# Patient Record
Sex: Female | Born: 2000 | Race: White | Hispanic: No | Marital: Single | Smoking: Never smoker
Health system: Southern US, Community
[De-identification: ages and names within clinical notes are randomized; demographics above are authoritative.]

## PROBLEM LIST (undated history)

## (undated) DIAGNOSIS — J45909 Unspecified asthma, uncomplicated: Secondary | ICD-10-CM

## (undated) DIAGNOSIS — F319 Bipolar disorder, unspecified: Secondary | ICD-10-CM

## (undated) DIAGNOSIS — R030 Elevated blood-pressure reading, without diagnosis of hypertension: Secondary | ICD-10-CM

## (undated) HISTORY — DX: Unspecified asthma, uncomplicated: J45.909

## (undated) HISTORY — PX: WISDOM TOOTH EXTRACTION: SHX21

## (undated) HISTORY — DX: Bipolar disorder, unspecified: F31.9

## (undated) HISTORY — DX: Elevated blood-pressure reading, without diagnosis of hypertension: R03.0

---

## 2020-03-21 DIAGNOSIS — R7401 Elevation of levels of liver transaminase levels: Secondary | ICD-10-CM | POA: Insufficient documentation

## 2020-03-21 DIAGNOSIS — R197 Diarrhea, unspecified: Secondary | ICD-10-CM | POA: Insufficient documentation

## 2020-03-22 DIAGNOSIS — F319 Bipolar disorder, unspecified: Secondary | ICD-10-CM | POA: Insufficient documentation

## 2020-05-03 DIAGNOSIS — M2550 Pain in unspecified joint: Secondary | ICD-10-CM | POA: Insufficient documentation

## 2020-05-03 DIAGNOSIS — R1114 Bilious vomiting: Secondary | ICD-10-CM | POA: Insufficient documentation

## 2020-05-03 DIAGNOSIS — K2100 Gastro-esophageal reflux disease with esophagitis, without bleeding: Secondary | ICD-10-CM | POA: Insufficient documentation

## 2020-05-03 DIAGNOSIS — Z8619 Personal history of other infectious and parasitic diseases: Secondary | ICD-10-CM | POA: Insufficient documentation

## 2020-05-03 DIAGNOSIS — R000 Tachycardia, unspecified: Secondary | ICD-10-CM | POA: Insufficient documentation

## 2020-05-03 DIAGNOSIS — I10 Essential (primary) hypertension: Secondary | ICD-10-CM | POA: Insufficient documentation

## 2020-05-23 ENCOUNTER — Other Ambulatory Visit: Payer: Self-pay | Admitting: Infectious Diseases

## 2020-05-23 DIAGNOSIS — R1114 Bilious vomiting: Secondary | ICD-10-CM

## 2020-05-23 DIAGNOSIS — F319 Bipolar disorder, unspecified: Secondary | ICD-10-CM

## 2020-05-30 DIAGNOSIS — R030 Elevated blood-pressure reading, without diagnosis of hypertension: Secondary | ICD-10-CM | POA: Insufficient documentation

## 2020-05-30 DIAGNOSIS — R42 Dizziness and giddiness: Secondary | ICD-10-CM | POA: Insufficient documentation

## 2020-05-31 ENCOUNTER — Ambulatory Visit
Admission: RE | Admit: 2020-05-31 | Discharge: 2020-05-31 | Disposition: A | Discharge status: Home / Self Care | Payer: 59 | Source: Ambulatory Visit | Attending: Infectious Diseases | Admitting: Infectious Diseases

## 2020-05-31 ENCOUNTER — Other Ambulatory Visit: Payer: Self-pay

## 2020-05-31 DIAGNOSIS — R1114 Bilious vomiting: Secondary | ICD-10-CM | POA: Diagnosis present

## 2020-05-31 DIAGNOSIS — F319 Bipolar disorder, unspecified: Secondary | ICD-10-CM | POA: Insufficient documentation

## 2020-08-09 ENCOUNTER — Encounter: Payer: Self-pay | Admitting: Internal Medicine

## 2020-09-13 ENCOUNTER — Ambulatory Visit: Payer: 59 | Admitting: Internal Medicine

## 2020-09-18 DIAGNOSIS — R058 Other specified cough: Secondary | ICD-10-CM | POA: Insufficient documentation

## 2020-10-04 ENCOUNTER — Other Ambulatory Visit: Payer: Self-pay

## 2020-10-04 ENCOUNTER — Encounter: Payer: Self-pay | Admitting: Internal Medicine

## 2020-10-04 ENCOUNTER — Ambulatory Visit (INDEPENDENT_AMBULATORY_CARE_PROVIDER_SITE_OTHER): Payer: 59 | Admitting: Internal Medicine

## 2020-10-04 VITALS — BP 135/86 | HR 109 | Ht 67.0 in | Wt 154.0 lb

## 2020-10-04 DIAGNOSIS — J45909 Unspecified asthma, uncomplicated: Secondary | ICD-10-CM | POA: Insufficient documentation

## 2020-10-04 DIAGNOSIS — R000 Tachycardia, unspecified: Secondary | ICD-10-CM | POA: Diagnosis not present

## 2020-10-04 MED ORDER — METOPROLOL SUCCINATE ER 50 MG PO TB24: 50.0000 mg | ORAL_TABLET | Freq: Every day | ORAL | 6 refills | Status: DC

## 2020-10-04 NOTE — Patient Instructions (Addendum)
Medication Instructions:  - Your physician has recommended you make the following change in your medication:   1) INCREASE Metoprolol succinate to 50 mg- take 1 tablet by mouth once daily   *If you need a refill on your cardiac medications before your next appointment, please call your pharmacy*   Lab Work: - none ordered  If you have labs (blood work) drawn today and your tests are completely normal, you will receive your results only by: MyChart Message (if you have MyChart) OR A paper copy in the mail If you have any lab test that is abnormal or we need to change your treatment, we will call you to review the results.   Testing/Procedures: - none ordered   Follow-Up: At [facility], you and your health needs are our priority.  As part of our continuing mission to provide you with exceptional heart care, we have created designated Provider Care Teams.  These Care Teams include your primary Cardiologist (physician) and Advanced Practice Providers (APPs -  Physician Assistants and Nurse Practitioners) who all work together to provide you with the care you need, when you need it.  We recommend signing up for the patient portal called "MyChart".  Sign up information is provided on this After Visit Summary.  MyChart is used to connect with patients for Virtual Visits (Telemedicine).  Patients are able to view lab/test results, encounter notes, upcoming appointments, etc.  Non-urgent messages can be sent to your provider as well.   To learn more about what you can do with MyChart, go to [email or web address].    Your next appointment:   6-8 weeks  (Thursday 12/06/20 @ 10:20 am)  The format for your next appointment:   In Person  Provider:   Sherryl Manges, MD   Other Instructions  1) Take 2 liquid IV a day

## 2020-10-04 NOTE — Progress Notes (Signed)
[place]     ELECTROPHYSIOLOGY CONSULT NOTE  Patient ID: Angelica Green, MRN: [id number], DOB/AGE: 09-Nov-2000 19 y.o. Admit date: (Not on file) Date of Consult: 10/04/2020  Primary Physician: Angelica Sell, MD Primary Cardiologist: Angelica Green is a 20 y.o. 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    HPI Angelica Green is a 20 y.o. female with a longstanding history of anxiety/bipolar disorder who last summer developed Lyme disease.  She was treated in the fall with antibiotics that was then complicated by C. difficile colitis requiring additional therapies.  Following these therapies she developed tachy palpitations aggravated by exercise heat showers often associated with lightheadedness and presyncope.  She underwent testing which was not available today but which apparently described heart rates in the 180s.  Plasma catecholamine levels were normal.  She was started on a beta-blocker.  Lightheadedness with standing yes; shower intolerance yes; menses no: heat intolerance yes/; exercise intolerance yes.  Diet is deplete of sodium; deplete of water.  She was told earlier want to increase her sodium and then told not to, this would be appropriate in the context of her elevated blood pressures as noted below  Does not use alcohol; does not use marijuana.  Syncope no.    Palpitations yes.    Recent viral infection.     She describes getting ill as a freshman in high school where in she was found to have orthostatic hypertension and bradycardia (see below).  The symptoms abated.  Some of her tachy palpitations abated as she was undergoing therapy for her Lyme disease only to recur following her C. Difficile  She also reports hypersomnolence.  She fell asleep during her biochemistry examination.  She feels unable to drive more than about 30 minutes at a time.  Reviewing her heart rate monitorv heart rates are in the 70s during  sleep  Has episodic vomiting.  This can be associate with prolonged concentration, prolonged heat exposure.  This occurred initially in the freshman illness where she was vomiting tens of times per hour.  Not attributed to stress.   Evaluation early 2022-cardiologist in [place]>> POTS based on office orthostatics; noted 2/22 here to have orthostatic tachycardia but also hypertension with blood pressures 132/90--142/98 with standing heart rates   Date Cr K Hgb TSH  2/22 0.7 4.1 14.4 1.95          Hx of Lyme, bipolar and Asthma, with Tx for Lyme complicated by increased LFTs and then C diff   Past Medical History:  Diagnosis Date   Asthma    Bipolar 1 disorder (HCC)    Elevated blood pressure reading       Surgical History:  Past Surgical History:  Procedure Laterality Date   WISDOM TOOTH EXTRACTION       Home Meds: Current Meds  Medication Sig   escitalopram (LEXAPRO) 20 MG tablet TAKE 1 TABLET BY MOUTH EVERYDAY AT BEDTIME   [name] 24 FE 1-20 MG-MCG(24) tablet Take 1 tablet by mouth daily.   lamoTRIgine (LAMICTAL) 200 MG tablet TAKE 1 TABLET BY MOUTH EVERY DAY IN THE MORNING   metoprolol succinate (TOPROL-XL) 50 MG 24 hr tablet Take 1 tablet (50 mg total) by mouth daily. Take with or immediately following a meal.   omeprazole (PRILOSEC) 40 MG capsule Take 1 capsule by mouth daily.   VYVANSE 20 MG capsule TAKE 1 CAPSULE IN THE MORNING   [DISCONTINUED]  metoprolol succinate (TOPROL-XL) 25 MG 24 hr tablet Take 1 tablet by mouth daily.    Allergies:  Allergies  Allergen Reactions   Doxycycline Nausea And Vomiting    Social History   Socioeconomic History   Marital status: Single    Spouse name: Not on file   Number of children: Not on file   Years of education: Not on file   Highest education level: Not on file  Occupational History   Not on file  Tobacco Use   Smoking status: Never    Passive exposure: Never   Smokeless tobacco: Never  Substance and Sexual  Activity   Alcohol use: Never   Drug use: Never   Sexual activity: Not on file  Other Topics Concern   Not on file  Social History Narrative   Not on file   Social Determinants of Health   Financial Resource Strain: Not on file  Food Insecurity: Not on file  Transportation Needs: Not on file  Physical Activity: Not on file  Stress: Not on file  Social Connections: Not on file  Intimate Partner Violence: Not on file     Family History  Problem Relation Age of Onset   Cancer Mother      ROS:  Please see the history of present illness.     All other systems reviewed and negative.    Physical Exam: Blood pressure 135/86, pulse (!) 109, height 5\' 7"  (1.702 m), weight 154 lb (69.9 kg), SpO2 98 %. General: Well developed, well nourished female in no acute distress. Head: Normocephalic, atraumatic, sclera non-icteric, no xanthomas, nares are without discharge. EENT: normal  Lymph Nodes:  none Neck: Negative for carotid bruits. JVD not elevated. Back:without scoliosis kyphosis Lungs: Clear bilaterally to auscultation without wheezes, rales, or rhonchi. Breathing is unlabored. Heart: RRR with S1 S2. No  /6 systolic murmur . No rubs, or gallops appreciated. Abdomen: Soft, non-tender, non-distended with normoactive bowel sounds. No hepatomegaly. No rebound/guarding. No obvious abdominal masses. Msk:  Strength and tone appear normal for age. Extremities: No clubbing or cyanosis. No edema.  Distal pedal pulses are 2+ and equal bilaterally. Skin: Warm and Dry Neuro: Alert and oriented X 3. CN III-XII intact Grossly normal sensory and motor function . Psych:  Responds to questions appropriately with a normal affect.      Labs: Cardiac Enzymes No results for input(s): CKTOTAL, CKMB, TROPONINI in the last 72 hours. CBC No results found for: WBC, HGB, HCT, MCV, PLT PROTIME: No results for input(s): LABPROT, INR in the last 72 hours. Chemistry No results for input(s): NA, K, CL,  CO2, BUN, CREATININE, CALCIUM, PROT, BILITOT, ALKPHOS, ALT, AST, GLUCOSE in the last 168 hours.  Invalid input(s): LABALBU Lipids No results found for: CHOL, HDL, LDLCALC, TRIG BNP No results found for: PROBNP Thyroid Function Tests: No results for input(s): TSH, T4TOTAL, T3FREE, THYROIDAB in the last 72 hours.  Invalid input(s): FREET3 Miscellaneous No results found for: DDIMER  Radiology/Studies:  No results found.  EKG: Sinus at 109 Interval 16/09/32   Assessment and Plan:  Post C. difficile orthostatic tachycardia/hypertension  Lyme disease-treated  Anxiety/bipolar  Intermittent vomiting  This young lady has new onset tachycardia and orthostatic intolerance following treatment for C. difficile, the antibiotics have not been prescribed for Lyme disease.  In her mind, her symptoms clearly began following the treatment for C. difficile although 6 years before some other illness was associate with orthostatic hypertension and bradycardia (???), a syndrome of which I am unaware  and the mechanism for which I cannot postulate.  Measurements recorded over the last 6 months have been associated with resting tachycardia, orthostatic increase in blood pressure and heart rate.  Nocturnal heart rates (inferred from daytime naps) have been normal.  I plasma metanephrines are normal.  I will need to review the sensitivity of these findings.  The timing of her symptoms would suggest that it is secondary to the infection/treatment as opposed to a spontaneous emergence of a separate process.  In the past, her blood pressures have been elevated; now they are on the low-normal side.  Hence have recommended increasing salt and water and we will also increase her metoprolol from 25--50 mg daily.  I have asked her to keep track of her blood pressures at home  We discussed extensively the issues of dysautonomia, the physiology of orthstasis and positional stress.  We discussed the role of salt and  water repletion, the importance of exercise, often needing to be started in the recumbent position, and the awareness of triggers and the role of ambient heat and dehydration  Encouraged her to try gentle exercise.   73 min was spent in care of the patient including the review of records      Sherryl Manges

## 2020-10-04 NOTE — Progress Notes (Signed)
Error

## 2020-10-05 ENCOUNTER — Telehealth: Payer: Self-pay | Admitting: Internal Medicine

## 2020-10-05 NOTE — Telephone Encounter (Signed)
Secure chat messages received from Dr. Graciela Husbands Dr. Earl Gala:  Dr. Graciela Husbands: Rosanne Ashing, good morning. Was wondering if you might be able to help me think as to whether referral to you for this young lady would be appropriate. She has symptoms to suggest to this ignorant old man, narcolepsy. She fell asleep during her biochemistry final. She cannot drive more than 15 or 20 minutes without being at risk for falling asleep. She has daytime somnolence in addition. Do not know whether she might have something as simple sleep apneabut was wondering if you could help me think about her.   Dr. Earl Gala: Abram Sander, I looked at her chart for her BMI to help direct my answer. It is normal. Basically a really sleepy 20 year old non-obese woman with no witnessed apnea is not likely to be OSA. A central disorder of hypersomnolence (narcolepsy with or without cataplexy, idiopathic hypersomnolence) is much more likely. Best strategy would be polysomnogram (standard night time sleep study) followed be Multiple Sleep Latency Test (MSLT) the next day. If PSG is positive for OSA the MSLT is cancelled. The problem is her escitalopram. That can suppress REM sleep (and cause [place] to miss dx of narcolepsy) and we usually want to stop for 10-14 days before if possible. However, if she then proves to have unexpected OSA you have stopped SSRI unnecessarily which is not a popular thing to do!! Sometimes I will do HST in that setting to be sure there is no OSA before tapering SSRI for PSG & MSLT. Also sometimes insurance requires HST before PSG even if I think there is no way the patient has OSA. Hope thoseideas help.  Dr. Graciela Husbands: do I order those things or are you available to see her and direct the testingthx steve  Dr. Earl Gala: It would be best if we see her first. The whole conversation about SSRI is probably more than you want to handle. My new colleague, Dr. Tempie Hoist cansee her next week or I can see her the week of 6/27.  Dr.  Graciela Husbands: Arvil Chaco that would be great We can reach out to her and 1) let her know your office will be calling or 2 ) whatever is best. her mental issues have been terrible in the past so it might involve working around the SSRI then changingit.    Dr. Earl Gala: Rip [place]. Let her know we will call. We will wait until Monday to give you time toreach her. Sometimes we do just have to do MSLT with SSRI on board   Dr Graciela Husbands: Ronal Fear could you let AT know that [place] office will be calling I had told her we woould reach out to them . thx SK

## 2020-10-05 NOTE — Telephone Encounter (Signed)
Attempted to call the patient regarding Dr. Odessa Fleming message from below. No answer- I left message stating I was sending some information through her MyChart, if she could please review that in relation to her visit yesterday.  I advised she did not need to call back unless she has any questions.

## 2020-11-05 ENCOUNTER — Encounter: Payer: Self-pay | Admitting: Internal Medicine

## 2020-12-06 ENCOUNTER — Ambulatory Visit: Payer: 59 | Admitting: Internal Medicine

## 2021-01-17 ENCOUNTER — Other Ambulatory Visit: Payer: Self-pay

## 2021-01-17 ENCOUNTER — Encounter: Payer: Self-pay | Admitting: Internal Medicine

## 2021-01-17 ENCOUNTER — Ambulatory Visit (INDEPENDENT_AMBULATORY_CARE_PROVIDER_SITE_OTHER): Payer: 59 | Admitting: Internal Medicine

## 2021-01-17 VITALS — BP 118/76 | HR 72 | Ht 67.0 in | Wt 151.0 lb

## 2021-01-17 DIAGNOSIS — G901 Familial dysautonomia [Riley-Day]: Secondary | ICD-10-CM | POA: Diagnosis not present

## 2021-01-17 DIAGNOSIS — R000 Tachycardia, unspecified: Secondary | ICD-10-CM | POA: Diagnosis not present

## 2021-01-17 NOTE — Progress Notes (Signed)
      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 difficile and Lyme disease and bipolar disorder for which she follows up closely with psychiatry  She had been initially started on low-dose metoprolol and we increased it from 25--50 which she has tolerated.  Her symptoms have been somewhat less.  Her diet is still sodium deplete but fluid intake is better  Mental health was given a bump with her mother's recent diagnosis of ALS and a 0.5-5-year prognostic time window   Records and Results Reviewed   Past Medical History:  Diagnosis Date   Asthma    Bipolar 1 disorder (HCC)    Elevated blood pressure reading     Past Surgical History:  Procedure Laterality Date   WISDOM TOOTH EXTRACTION      Current Meds  Medication Sig   escitalopram (LEXAPRO) 20 MG tablet TAKE 1 TABLET BY MOUTH EVERYDAY AT BEDTIME   [name] 24 FE 1-20 MG-MCG(24) tablet Take 1 tablet by mouth daily.   lamoTRIgine (LAMICTAL) 200 MG tablet TAKE 1 TABLET BY MOUTH EVERY DAY IN THE MORNING   metoprolol succinate (TOPROL-XL) 50 MG 24 hr tablet Take 1 tablet (50 mg total) by mouth daily. Take with or immediately following a meal.   VYVANSE 20 MG capsule TAKE 1 CAPSULE IN THE MORNING    Allergies  Allergen Reactions   Doxycycline Nausea And Vomiting      Review of Systems negative except from HPI and PMH  Physical Exam BP 118/76 (BP Location: Right Arm, Patient Position: Sitting, Cuff Size: Normal)   Pulse 72   Ht 5\' 7"  (1.702 m)   Wt 151 lb (68.5 kg)   SpO2 96%   BMI 23.65 kg/m  Well developed and well nourished in no acute distress HENT normal E scleral and icterus clear Neck Supple JVP flat; carotids brisk and full Clear to ausculation Regular rate and rhythm, no murmurs gallops or  rub Soft with active bowel sounds No clubbing cyanosis  Edema Alert and oriented, grossly normal motor and sensory function Skin Warm and Dry  ECG sinus at 72 Interval 17/09/40 Otherwise normal  CrCl cannot be calculated (No successful lab value found.).   Assessment and  Plan Post C. difficile orthostatic tachycardia/hypertension   Lyme disease-treated   Anxiety/bipolar   Overall Angelica Green has been doing better, but still with exertional intolerance some lightheadedness.  Symptom exacerbations persist around her menses. Encouraged her to increase her salt repletion from liquid IV x1--x3 a day. She is avoiding social provocations like marijuana and alcohol.  A sensible young lady.  Mom's situation is a big deal.  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  The patient does not  have concerns regarding medicines.

## 2021-01-17 NOTE — Patient Instructions (Signed)
Medication Instructions:  - Your physician recommends that you continue on your current medications as directed. Please refer to the Current Medication list given to you today.  *If you need a refill on your cardiac medications before your next appointment, please call your pharmacy*   Lab Work: - none ordered  If you have labs (blood work) drawn today and your tests are completely normal, you will receive your results only by: MyChart Message (if you have MyChart) OR A paper copy in the mail If you have any lab test that is abnormal or we need to change your treatment, we will call you to review the results.   Testing/Procedures: - none ordered   Follow-Up: At [facility], you and your health needs are our priority.  As part of our continuing mission to provide you with exceptional heart care, we have created designated Provider Care Teams.  These Care Teams include your primary Cardiologist (physician) and Advanced Practice Providers (APPs -  Physician Assistants and Nurse Practitioners) who all work together to provide you with the care you need, when you need it.  We recommend signing up for the patient portal called "MyChart".  Sign up information is provided on this After Visit Summary.  MyChart is used to connect with patients for Virtual Visits (Telemedicine).  Patients are able to view lab/test results, encounter notes, upcoming appointments, etc.  Non-urgent messages can be sent to your provider as well.   To learn more about what you can do with MyChart, go to [email or web address].    Your next appointment:   6 month(s)  The format for your next appointment:   In Person  Provider:   Sherryl Manges, MD   Other Instructions  1) INCREASE Liquid IV to 3 a day

## 2021-04-03 ENCOUNTER — Other Ambulatory Visit: Payer: Self-pay | Admitting: Internal Medicine

## 2021-07-04 ENCOUNTER — Ambulatory Visit: Payer: 59 | Admitting: Internal Medicine

## 2021-09-05 ENCOUNTER — Ambulatory Visit: Payer: 59 | Admitting: Internal Medicine

## 2022-04-14 IMAGING — CT CT HEAD W/O CM
1 series · 15 of 30 positions shown, 19 images · non-contrast
Comparison: No pertinent prior exams available for comparison.

CLINICAL DATA: Bilious vomiting without nausea. Bipolar affective
disorder, remission status unspecified. Additional history provided
by scanning technologist: Patient reports "random" vomiting with
dizziness, symptoms for 2 months, right eye vision changes.

EXAM:
CT HEAD WITHOUT CONTRAST
TECHNIQUE: Contiguous axial images were obtained from the base of the skull
through the vertex without intravenous contrast.

[Series 2: head wo · axial · 0.39mm/px · z∈[-142,-7]mm · 15 of 30 slices shown, 19 images]
[im 2/30  brain]
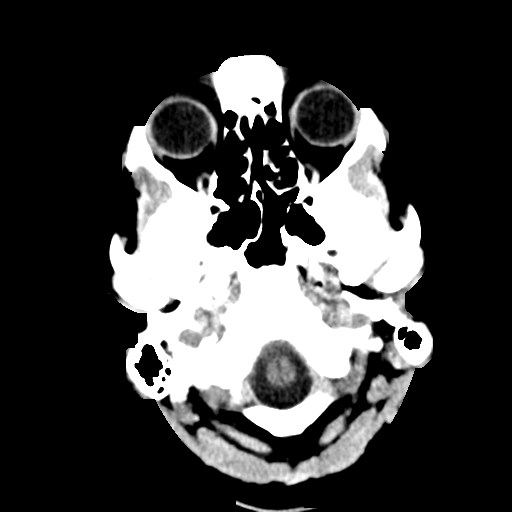
[im 2/30  bone]
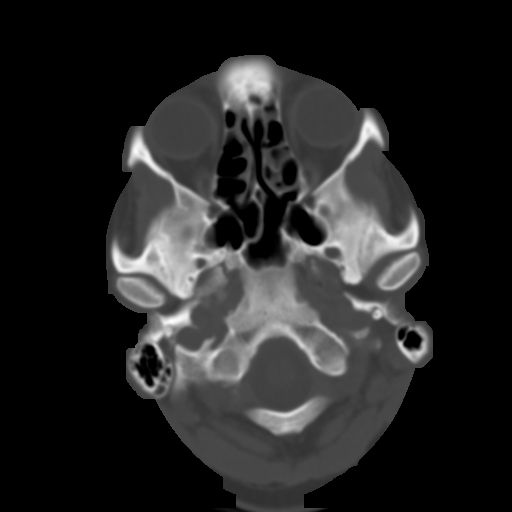
[im 4/30  brain]
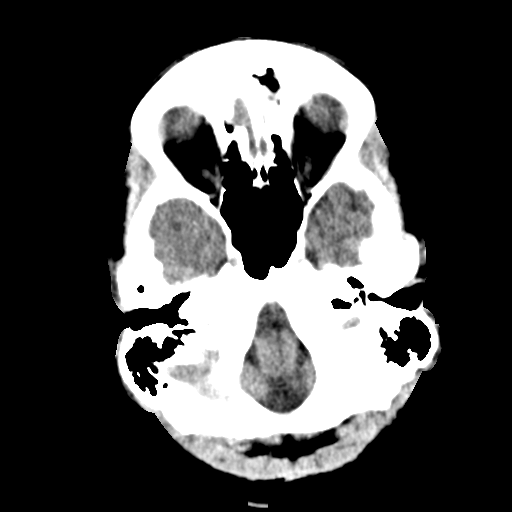
[im 6/30  brain]
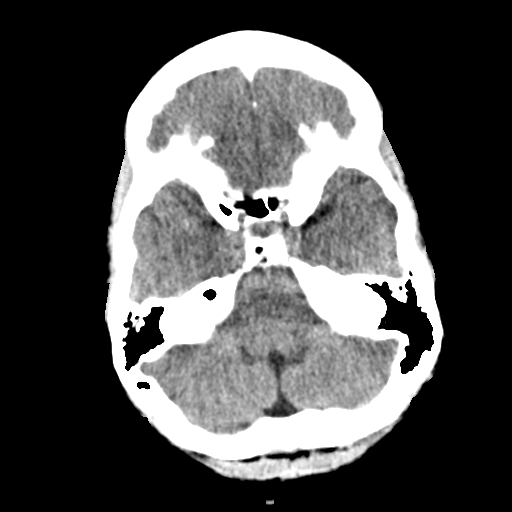
[im 8/30  brain]
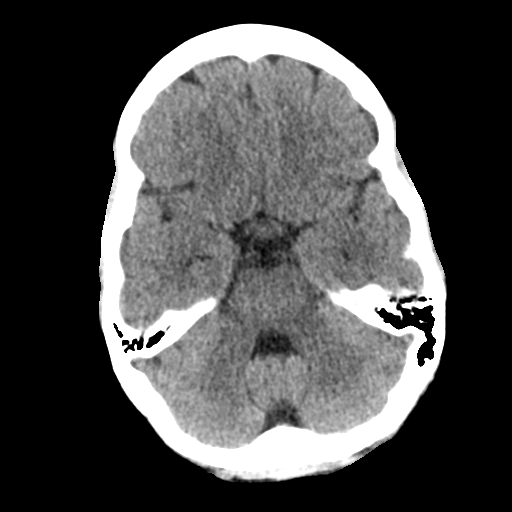
[im 10/30  brain]
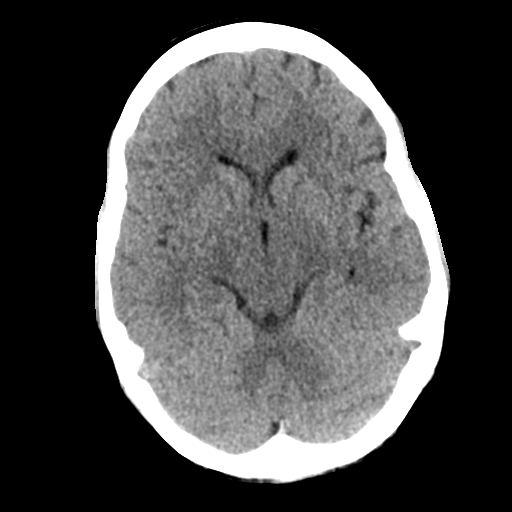
[im 10/30  bone]
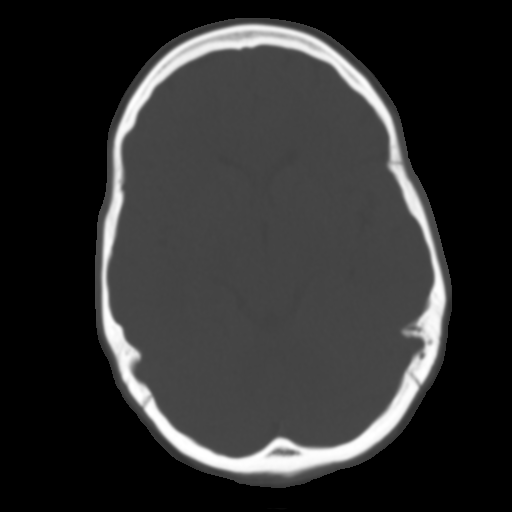
[im 12/30  brain]
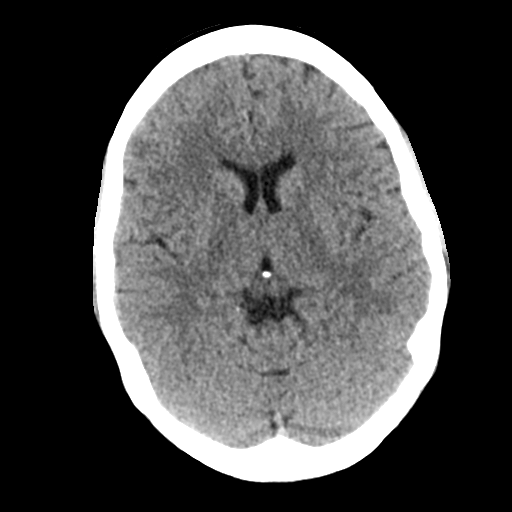
[im 14/30  brain]
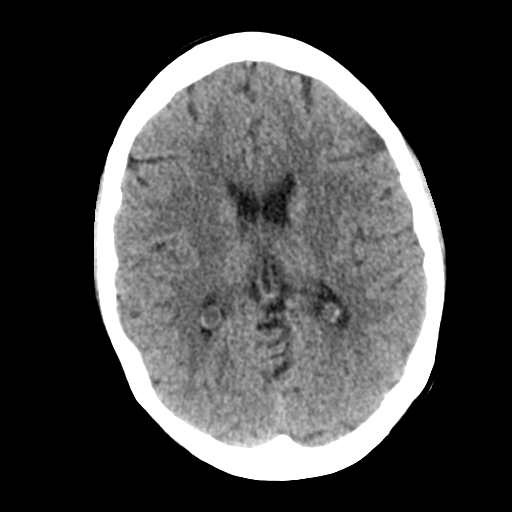
[im 16/30  brain]
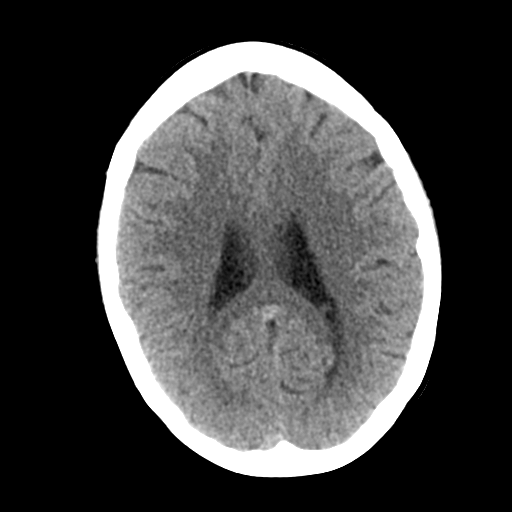
[im 17/30  brain]
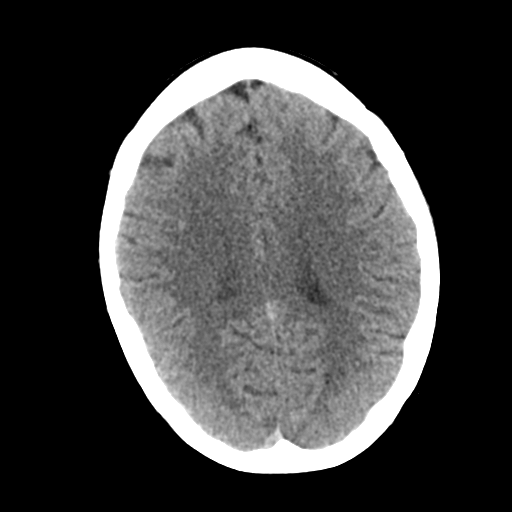
[im 17/30  bone]
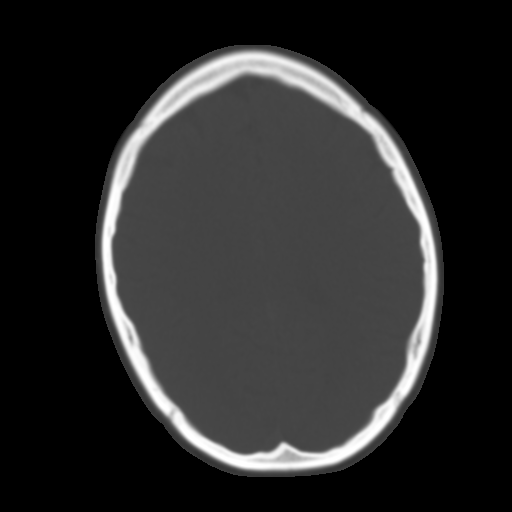
[im 19/30  brain]
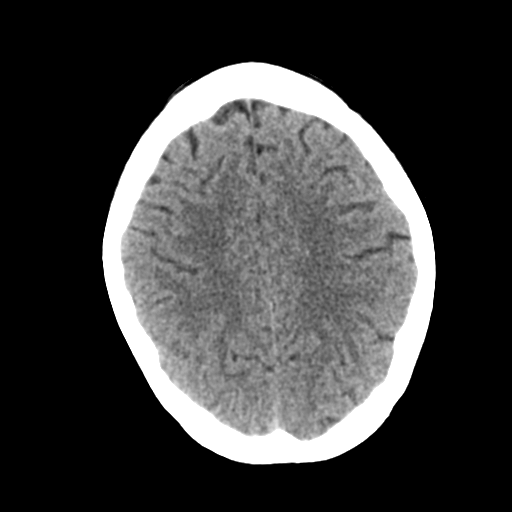
[im 21/30  brain]
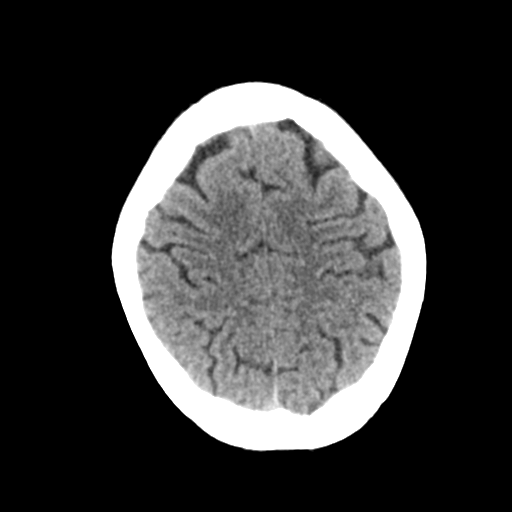
[im 23/30  brain]
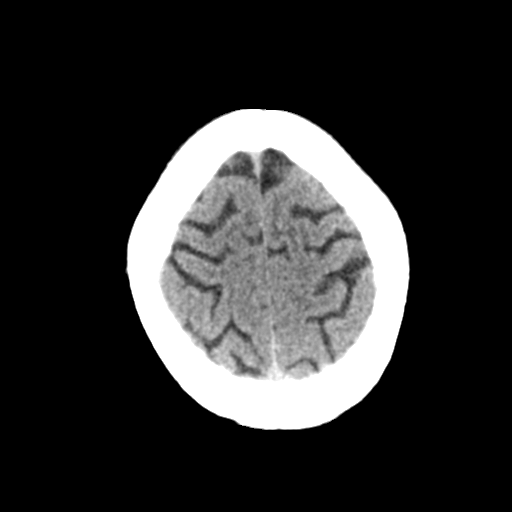
[im 25/30  brain]
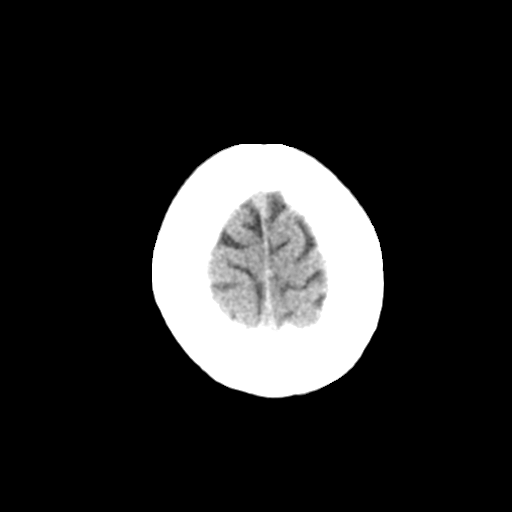
[im 25/30  bone]
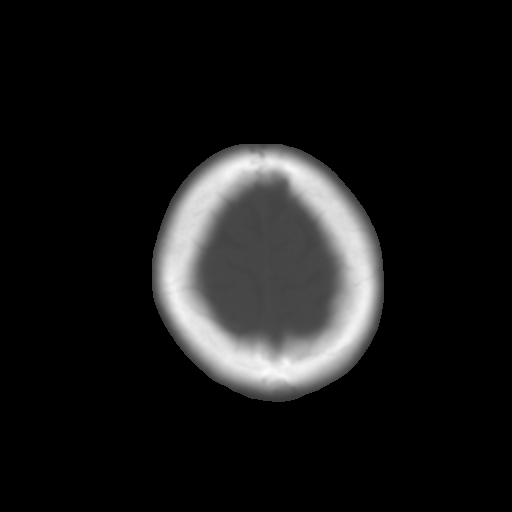
[im 27/30  brain]
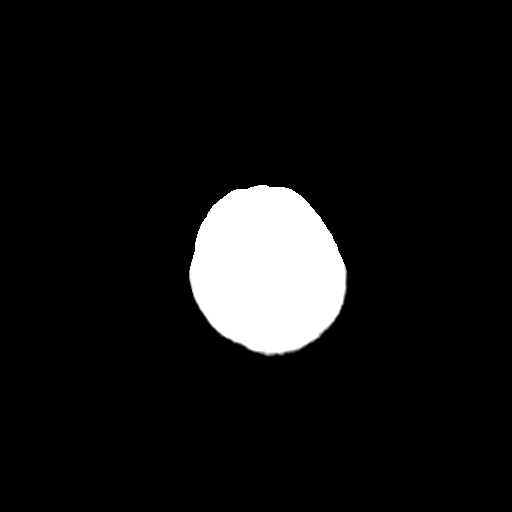
[im 29/30  brain]
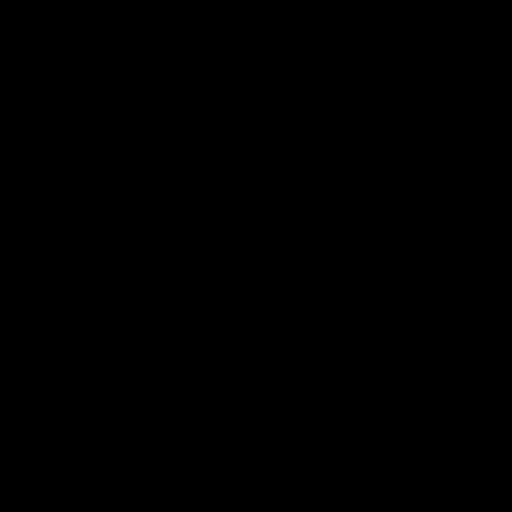

[15 of 30 positions shown; findings below may reference images not displayed]

FINDINGS: Brain:

Cerebral volume is normal.

There is no acute intracranial hemorrhage.

No demarcated cortical infarct.

No extra-axial fluid collection.

No evidence of intracranial mass.

No midline shift.

Vascular: No hyperdense vessel.

Skull: Normal. Negative for fracture or focal lesion.

Sinuses/Orbits: Visualized orbits show no acute finding. Hypoplastic
frontal sinuses. Mild-to-moderate bilateral ethmoid sinus mucosal
thickening. Mild bilateral sphenoid sinus mucosal thickening.
IMPRESSION: Unremarkable non-contrast CT appearance of the brain. No evidence of
acute intracranial abnormality.

Mild-to-moderate paranasal sinus mucosal thickening at the imaged
levels, as described.
